# Patient Record
Sex: Female | Born: 1970 | Hispanic: No | Smoking: Never smoker
Health system: Southern US, Community
[De-identification: ages and names within clinical notes are randomized; demographics above are authoritative.]

## PROBLEM LIST (undated history)

## (undated) HISTORY — PX: NO PAST SURGERIES: SHX2092

---

## 2017-05-10 ENCOUNTER — Ambulatory Visit (INDEPENDENT_AMBULATORY_CARE_PROVIDER_SITE_OTHER): Payer: 59 | Admitting: Pediatrics

## 2017-05-10 ENCOUNTER — Encounter: Payer: Self-pay | Admitting: Pediatrics

## 2017-05-10 VITALS — BP 118/74 | HR 80 | Temp 98.4°F | Resp 18 | Ht 64.65 in | Wt 186.2 lb

## 2017-05-10 DIAGNOSIS — L503 Dermatographic urticaria: Secondary | ICD-10-CM | POA: Diagnosis not present

## 2017-05-10 DIAGNOSIS — T782XXA Anaphylactic shock, unspecified, initial encounter: Secondary | ICD-10-CM | POA: Insufficient documentation

## 2017-05-10 DIAGNOSIS — T782XXD Anaphylactic shock, unspecified, subsequent encounter: Secondary | ICD-10-CM

## 2017-05-10 DIAGNOSIS — J4530 Mild persistent asthma, uncomplicated: Secondary | ICD-10-CM

## 2017-05-10 DIAGNOSIS — J3089 Other allergic rhinitis: Secondary | ICD-10-CM | POA: Diagnosis not present

## 2017-05-10 DIAGNOSIS — J45909 Unspecified asthma, uncomplicated: Secondary | ICD-10-CM | POA: Insufficient documentation

## 2017-05-10 MED ORDER — MONTELUKAST SODIUM 10 MG PO TABS: 10.0000 mg | ORAL_TABLET | Freq: Every day | ORAL | 5 refills | Status: AC

## 2017-05-10 MED ORDER — FLUTICASONE PROPIONATE 50 MCG/ACT NA SUSP: NASAL | 5 refills | Status: AC

## 2017-05-10 MED ORDER — ALBUTEROL SULFATE HFA 108 (90 BASE) MCG/ACT IN AERS: 2.0000 | INHALATION_SPRAY | RESPIRATORY_TRACT | 1 refills | Status: AC | PRN

## 2017-05-10 NOTE — Progress Notes (Signed)
[address] [place] [id number] Dept: [phone]  New Patient Note  Patient ID: Stacey Bright, female    DOB: 02/04/1971  Age: 46 y.o. MRN: [id number] Date of Office Visit: 05/10/2017 Referring provider: No referring provider defined for this encounter.    Chief Complaint: Allergic Reaction (April 19, 2017.  itching then hives generalized areas, chest heaviness, difficulty swallowing and sensation of swelling.  [facility]  HPI Stacey Bright presents for e. valuation of an allergic reaction on 04/19/2017 . She woke up at 5 AM with hives, shortness of breath, difficulty swallowing and hoarseness. She went to the emergency room was treated with Benadryl and prednisone. She was given EpiPen. Over the past few weeks , she has had some coughing and fullness in the chest . During this time , at times she feels like her vocal cords may be swollen She has never had urticaria in the past. She did not eat unusual foods the  night before. She has been on 4 different supplements for several weeks. She has taken each of these supplements in the past week without any problem .She does not have a history of asthma, eczema or chronic urticaria. She has not had previous allergic reactions. She was  very upset the day before  the reaction occurred. At times she has nasal congestion. She does not have a history of tick bites. She did not have mammalian  proteins the  night before the reaction  Review of Systems  Constitutional: Negative.   HENT:       Nasal congestion at times  Eyes: Negative.   Respiratory:       Coughing and fullness skin the chest for a few weeks. At times feeling of swelling of her vocal cords  Cardiovascular: Negative.   Gastrointestinal: Negative.   Genitourinary: Negative.   Musculoskeletal: Negative.   Skin:       Allergic reaction on 04/19/2017 associated with hives  Neurological: Negative.   Endo/Heme/Allergies:       No diabetes or thyroid disease    Psychiatric/Behavioral: Negative.     Outpatient Encounter Prescriptions as of 05/10/2017  Medication Sig  . b complex vitamins tablet Take 1 tablet by mouth daily.  . calcium-vitamin D 250-100 MG-UNIT tablet Take 1 tablet by mouth 2 (two) times daily.  Marland Kitchen. EPINEPHrine 0.3 mg/0.3 mL IJ SOAJ injection Inject 0.3 mg into the muscle.  . ferrous sulfate 325 (65 FE) MG tablet Take 325 mg by mouth daily with breakfast.  . Chilton SiGreen Tea 150 MG CAPS Take by mouth.  . Multiple Vitamins-Minerals (CENTRUM ADULTS PO) Take by mouth.  . NONFORMULARY OR COMPOUNDED ITEM NUTRIMOST WELLNESS AND WEIGHT LOSS DIET PLAN.  . ranitidine (ZANTAC) 150 MG tablet TAKE 1 TABLET BY MOUTH DAILY AT NIGHT  . vitamin E 100 UNIT capsule Take by mouth daily.  Marland Kitchen. WAL-DRYL ALLERGY 25 MG tablet TAKE 1 T BY MOUTH EVERY 6 HOURS FOR 4 DAYS  . albuterol (VENTOLIN HFA) 108 (90 Base) MCG/ACT inhaler Inhale 2 puffs into the lungs every 4 (four) hours as needed for wheezing or shortness of breath.  . fluticasone (FLONASE) 50 MCG/ACT nasal spray Two sprays each nostril once a day for nasal congestion  . montelukast (SINGULAIR) 10 MG tablet Take 1 tablet (10 mg total) by mouth at bedtime.  . predniSONE (DELTASONE) 20 MG tablet    No facility-administered encounter medications on file as of 05/10/2017.      Drug Allergies:  No Known Allergies  Family History: Landen's family history is not on file.. Family history is negative for asthma, hay fever, sinus problems, angioedema, eczema, hives, food allergies, lupus, chronic bronchitis or emphysema.  They have a cat that comes in and out of the house. She is not exposed to cigarette smoking. She has never smoked cigarettes. She is a Psychologist, counselling. Her symptoms are not worse at work.  Physical Exam: BP 118/74 (BP Location: Right Arm, Patient Position: Sitting, Cuff Size: Normal)   Pulse 80   Temp 98.4 F (36.9 C) (Oral)   Resp 18   Ht 5' 4.65" (1.642 m)   Wt 186 lb 3.2 oz (84.5 kg)    SpO2 97%   BMI 31.33 kg/m    Physical Exam  Constitutional: She is oriented to person, place, and time. She appears well-developed and well-nourished.  HENT:  Eyes normal. Ears normal. Nose mild swelling of nasal  turbinates. Pharynx normal.  Neck: Neck supple. No thyromegaly present.  Cardiovascular:  S1 and S2 normal no murmurs  Pulmonary/Chest:  Clear to percussion and auscultation  Abdominal: Soft. There is no tenderness (no hepatosplenomegaly).  Lymphadenopathy:    She has no cervical adenopathy.  Neurological: She is alert and oriented to person, place, and time.  Skin:  Clear but there was some dermographia noted  Psychiatric: She has a normal mood and affect. Her behavior is normal. Judgment and thought content normal.  Vitals reviewed.   Diagnostics: FVC 3.34 L FEV1 2.79 L. Predicted FVC 3.13 L predicted FEV1 2.59 L. After albuterol 2 puffs FVC 3.12 L FEV1 2.64 L-the spirometry is in the normal range and there was no significant improvement after albuterol  Allergy skin tests were positive to dust mites and cockroach . Skin testing to foods was negative   Assessment  Assessment and Plan: 1. Mild persistent reactive airway disease without complication   2. Other allergic rhinitis   3. Dermographia   4. Anaphylaxis, subsequent encounter     Meds ordered this encounter  Medications  . fluticasone (FLONASE) 50 MCG/ACT nasal spray    Sig: Two sprays each nostril once a day for nasal congestion    Dispense:  16 g    Refill:  5  . montelukast (SINGULAIR) 10 MG tablet    Sig: Take 1 tablet (10 mg total) by mouth at bedtime.    Dispense:  30 tablet    Refill:  5  . albuterol (VENTOLIN HFA) 108 (90 Base) MCG/ACT inhaler    Sig: Inhale 2 puffs into the lungs every 4 (four) hours as needed for wheezing or shortness of breath.    Dispense:  8 g    Refill:  1    Patient Instructions  Environmental control of dust mite Allegra 180 mg-take 1 tablet once a day for runny  nose or itching Fluticasone 2 sprays per nostril once a day if needed for stuffy nose Montelukast  10 mg-take 1 tablet once a day for coughing or wheezing Ventolin 2 puffs every 4 hours if needed for wheezing or coughing spells Add prednisone 10 mg twice a day for 4 days, 10 mg on the fifth day. Did it  help the cough Call me if you're not doing better on this treatment plan  If you have an allergic reaction take Benadryl 50 mg every 4 hours, and if you have life-threatening symptoms inject  with EpiPen 0.3 mg . Then write what you had to eat or drink in the previous 4 hours  Return in about 6 weeks (around 06/21/2017).   Thank you for the opportunity to care for this patient.  Please do not hesitate to contact me with questions.  Tonette Bihari, M.D.  [facility] [address] [place], [place] [address] [phone]

## 2017-05-10 NOTE — Patient Instructions (Signed)
Environmental control of dust mite Allegra 180 mg-take 1 tablet once a day for runny nose or itching Fluticasone 2 sprays per nostril once a day if needed for stuffy nose Montelukast  10 mg-take 1 tablet once a day for coughing or wheezing Ventolin 2 puffs every 4 hours if needed for wheezing or coughing spells Add prednisone 10 mg twice a day for 4 days, 10 mg on the fifth day. Did it  help the cough Call me if you're not doing better on this treatment plan  If you have an allergic reaction take Benadryl 50 mg every 4 hours, and if you have life-threatening symptoms inject  with EpiPen 0.3 mg . Then write what you had to eat or drink in the previous 4 hours

## 2019-02-16 ENCOUNTER — Emergency Department (HOSPITAL_COMMUNITY): Payer: BLUE CROSS/BLUE SHIELD

## 2019-02-16 ENCOUNTER — Encounter: Payer: Self-pay | Admitting: Emergency Medicine

## 2019-02-16 ENCOUNTER — Emergency Department (HOSPITAL_COMMUNITY)
Admission: EM | Admit: 2019-02-16 | Discharge: 2019-02-16 | Disposition: A | Discharge status: Home / Self Care | Payer: BLUE CROSS/BLUE SHIELD | Attending: Emergency Medicine | Admitting: Emergency Medicine

## 2019-02-16 DIAGNOSIS — Y9389 Activity, other specified: Secondary | ICD-10-CM | POA: Diagnosis not present

## 2019-02-16 DIAGNOSIS — Y999 Unspecified external cause status: Secondary | ICD-10-CM | POA: Diagnosis not present

## 2019-02-16 DIAGNOSIS — M25551 Pain in right hip: Secondary | ICD-10-CM | POA: Insufficient documentation

## 2019-02-16 DIAGNOSIS — M549 Dorsalgia, unspecified: Secondary | ICD-10-CM | POA: Diagnosis not present

## 2019-02-16 DIAGNOSIS — M542 Cervicalgia: Secondary | ICD-10-CM | POA: Insufficient documentation

## 2019-02-16 DIAGNOSIS — S0512XA Contusion of eyeball and orbital tissues, left eye, initial encounter: Secondary | ICD-10-CM | POA: Insufficient documentation

## 2019-02-16 DIAGNOSIS — R0789 Other chest pain: Secondary | ICD-10-CM | POA: Insufficient documentation

## 2019-02-16 DIAGNOSIS — M545 Low back pain: Secondary | ICD-10-CM | POA: Insufficient documentation

## 2019-02-16 DIAGNOSIS — S0511XA Contusion of eyeball and orbital tissues, right eye, initial encounter: Secondary | ICD-10-CM | POA: Insufficient documentation

## 2019-02-16 DIAGNOSIS — S0592XA Unspecified injury of left eye and orbit, initial encounter: Secondary | ICD-10-CM | POA: Diagnosis present

## 2019-02-16 DIAGNOSIS — R51 Headache: Secondary | ICD-10-CM | POA: Diagnosis not present

## 2019-02-16 DIAGNOSIS — Y92039 Unspecified place in apartment as the place of occurrence of the external cause: Secondary | ICD-10-CM | POA: Insufficient documentation

## 2019-02-16 DIAGNOSIS — Z79899 Other long term (current) drug therapy: Secondary | ICD-10-CM | POA: Diagnosis not present

## 2019-02-16 LAB — POCT I-STAT EG7
Acid-Base Excess: 1 mmol/L (ref 0.0–2.0)
Bicarbonate: 27 mmol/L (ref 20.0–28.0)
Calcium, Ion: 1.17 mmol/L (ref 1.15–1.40)
HCT: 40 % (ref 36.0–46.0)
Hemoglobin: 13.6 g/dL (ref 12.0–15.0)
O2 Saturation: 64 %
Potassium: 3.5 mmol/L (ref 3.5–5.1)
Sodium: 140 mmol/L (ref 135–145)
TCO2: 28 mmol/L (ref 22–32)
pCO2, Ven: 45.7 mmHg (ref 44.0–60.0)
pH, Ven: 7.379 (ref 7.250–7.430)
pO2, Ven: 34 mmHg (ref 32.0–45.0)

## 2019-02-16 LAB — I-STAT CREATININE, ED: Creatinine, Ser: 0.7 mg/dL (ref 0.44–1.00)

## 2019-02-16 LAB — CBG MONITORING, ED: Glucose-Capillary: 77 mg/dL (ref 70–99)

## 2019-02-16 LAB — CK: Total CK: 429 U/L — ABNORMAL HIGH (ref 38–234)

## 2019-02-16 MED ORDER — IOHEXOL 300 MG/ML  SOLN: 100.0000 mL | Freq: Once | INTRAMUSCULAR | Status: DC | PRN

## 2019-02-16 MED ORDER — METHOCARBAMOL 500 MG PO TABS: 500.0000 mg | ORAL_TABLET | Freq: Two times a day (BID) | ORAL | 0 refills | Status: AC

## 2019-02-16 MED ORDER — IOHEXOL 350 MG/ML SOLN
100.0000 mL | Freq: Once | INTRAVENOUS | Status: AC | PRN
  Administered 2019-02-16: 100 mL via INTRAVENOUS

## 2019-02-16 MED ORDER — OXYCODONE-ACETAMINOPHEN 5-325 MG PO TABS: 1.0000 | ORAL_TABLET | Freq: Four times a day (QID) | ORAL | 0 refills | Status: AC | PRN

## 2019-02-16 MED ORDER — HYDROMORPHONE HCL 1 MG/ML IJ SOLN
0.5000 mg | Freq: Once | INTRAMUSCULAR | Status: AC
  Administered 2019-02-16: 0.5 mg via INTRAVENOUS
  Filled 2019-02-16: qty 1

## 2019-02-16 MED ORDER — TETANUS-DIPHTH-ACELL PERTUSSIS 5-2.5-18.5 LF-MCG/0.5 IM SUSP: 0.5000 mL | Freq: Once | INTRAMUSCULAR | Status: DC

## 2019-02-16 MED ORDER — SODIUM CHLORIDE (PF) 0.9 % IJ SOLN
INTRAMUSCULAR | Status: AC
  Filled 2019-02-16: qty 50

## 2019-02-16 MED ORDER — SODIUM CHLORIDE 0.9 % IV BOLUS
1000.0000 mL | Freq: Once | INTRAVENOUS | Status: AC
  Administered 2019-02-16: 1000 mL via INTRAVENOUS

## 2019-02-16 NOTE — ED Notes (Signed)
[employer] at bedside per pt request  
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
## 2019-02-16 NOTE — ED Notes (Signed)
Spoke with Denny Peon social work; informs this nurse she is speaking with pt's via pt phone in room- pt talking to Child psychotherapist on phone.
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
## 2019-02-16 NOTE — ED Notes (Signed)
[employer] at bedside 

## 2019-02-16 NOTE — Discharge Instructions (Addendum)
Thank you for allowing me to care for you today in the Emergency Department.   Call to schedule follow-up appoint with your primary care provider for recheck in the next week.  It is normal to be sore, particularly worse 24 to 48 hours after an injury occurs.  To treat your pain at home, apply an ice pack to areas that are sore for 15 to 20 minutes as frequently as needed.  Avoid applying ice directly to the skin because this can cause burns.   Take 600 mg of ibuprofen with food or 650 mg of Tylenol once every 6 hours for pain control you also alternate between these 2 medications once every 3 hours if your pain returns.  For severe, uncontrollable pain, you can take 1 tablet of Percocet every 6 hours as needed.  This medication is a narcotic.  It can be addicting.  You should not take with any other substances that can make you sleepy or sedated.  You should also not work or drive until you know how this medication impacts you.  Do not take it with Robaxin.  Robaxin is a muscle relaxer that you can take up to 2 times per day.  You can take this safely with ibuprofen and Tylenol for pain control.  Return to the emergency department if you develop severe shortness of breath, if you have any new injuries, develop new numbness or weakness, if you pass out, develop new visual changes, or other new, concerning symptoms.

## 2019-02-16 NOTE — ED Notes (Signed)
This nurse attempted to call social work r/t pending consult , no answer at this time.
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
## 2019-02-16 NOTE — ED Notes (Signed)
Attempted to call social work , no answer.

## 2019-02-16 NOTE — Progress Notes (Addendum)
9:43am: CSW spoke with patient via phone regarding disposition concerns. Patient voiced feeling safe with returning home and did not want information for a womens shelter- I did place this information on her AVS in the event she changed her mind. Patient voiced questions about getting a 50B and requested off-duty office to speak her with. CSW notified RN and patients questions- off duty will go see patient again. Please notify this write if patient has anymore questions.   CSW aware of consult and patient requesting housing resources. CSW will provide patient with housing information however unsure if any homeless/ womens shelter is accepting any need admissions.   Will follow up.   Stacy Gardner, LCSW Transitions of Care [facility]  [phone]

## 2019-02-16 NOTE — ED Notes (Signed)
Pt d/c home per MD order. Discharge summary reviewed with pt. Pt verbalizes understanding. Informs nurse she feels safe and feels safe going home at discharge. [place] RN called cab per pt request. Pt ambulatory off unit.
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
